# Patient Record
Sex: Female | Born: 1965 | Race: White | Hispanic: No | Marital: Married | Smoking: Never smoker
Health system: Southern US, Community
[De-identification: ages and names within clinical notes are randomized; demographics above are authoritative.]

## PROBLEM LIST (undated history)

## (undated) DIAGNOSIS — Z90721 Acquired absence of ovaries, unilateral: Secondary | ICD-10-CM

---

## 1999-08-10 ENCOUNTER — Other Ambulatory Visit: Admission: RE | Admit: 1999-08-10 | Discharge: 1999-08-10 | Payer: Self-pay | Admitting: Obstetrics and Gynecology

## 2000-02-09 ENCOUNTER — Inpatient Hospital Stay (HOSPITAL_COMMUNITY): Admission: AD | Admit: 2000-02-09 | Discharge: 2000-02-12 | Payer: Self-pay | Admitting: Obstetrics and Gynecology

## 2001-03-16 ENCOUNTER — Other Ambulatory Visit: Admission: RE | Admit: 2001-03-16 | Discharge: 2001-03-16 | Payer: Self-pay | Admitting: Obstetrics and Gynecology

## 2001-08-14 ENCOUNTER — Encounter: Admission: RE | Admit: 2001-08-14 | Discharge: 2001-08-14 | Payer: Self-pay | Admitting: Obstetrics and Gynecology

## 2001-08-14 ENCOUNTER — Encounter: Payer: Self-pay | Admitting: Obstetrics and Gynecology

## 2001-09-21 ENCOUNTER — Other Ambulatory Visit: Admission: RE | Admit: 2001-09-21 | Discharge: 2001-09-21 | Payer: Self-pay | Admitting: Obstetrics and Gynecology

## 2002-04-17 ENCOUNTER — Other Ambulatory Visit: Admission: RE | Admit: 2002-04-17 | Discharge: 2002-04-17 | Payer: Self-pay | Admitting: Obstetrics and Gynecology

## 2003-04-24 ENCOUNTER — Other Ambulatory Visit: Admission: RE | Admit: 2003-04-24 | Discharge: 2003-04-24 | Payer: Self-pay | Admitting: Obstetrics and Gynecology

## 2004-09-23 ENCOUNTER — Other Ambulatory Visit: Admission: RE | Admit: 2004-09-23 | Discharge: 2004-09-23 | Payer: Self-pay | Admitting: Obstetrics and Gynecology

## 2005-11-21 ENCOUNTER — Other Ambulatory Visit: Admission: RE | Admit: 2005-11-21 | Discharge: 2005-11-21 | Payer: Self-pay | Admitting: Obstetrics and Gynecology

## 2006-09-25 ENCOUNTER — Encounter: Admission: RE | Admit: 2006-09-25 | Discharge: 2006-09-25 | Payer: Self-pay | Admitting: Obstetrics and Gynecology

## 2007-10-09 ENCOUNTER — Encounter: Admission: RE | Admit: 2007-10-09 | Discharge: 2007-10-09 | Payer: Self-pay | Admitting: Obstetrics and Gynecology

## 2008-10-17 ENCOUNTER — Encounter: Admission: RE | Admit: 2008-10-17 | Discharge: 2008-10-17 | Payer: Self-pay | Admitting: Obstetrics and Gynecology

## 2009-10-19 ENCOUNTER — Encounter: Admission: RE | Admit: 2009-10-19 | Discharge: 2009-10-19 | Payer: Self-pay | Admitting: Obstetrics and Gynecology

## 2010-10-01 NOTE — H&P (Signed)
[facility] of [facility]  Patient:    Natasha Burke, Natasha Burke                           MRN: [id number] Adm. Date:  02/09/00 Attending:  Alvino Chapel, M.D. CC:         Preop holding for surgery on September 26, 7:30 a.m.   History and Physical  Patient scheduled for repeat cesarean section on Wednesday, September 26, at 7:30 a.m.  HISTORY OF PRESENT ILLNESS:   The patient is a 45 year old, G3, P1-0-1-1, who is admitted at [redacted] weeks gestation for scheduled repeat cesarean section.  The patients estimated due date is February 16, 2000, by last menstrual period consistent with a first trimester sonogram.  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  Otherwise, the patient had previous cesarean section and no other complications this pregnancy.  PRENATAL LABORATORY DATA:     O positive, antibody negative, RPR nonreactive, rubella immune, hepatitis B surface antigen negative, HIV declined, GC negative, chlamydia negative, group B strep negative.  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  The patient had a 9 pound 4 ounce infant by C section after a failed trial of labor.  PAST MEDICAL HISTORY:         None.  PAST SURGICAL HISTORY:        Cesarean section in 1999, D&E in 1997, ankle surgery, tonsillectomy.  PAST GYNECOLOGIC HISTORY:     None with no abnormal Pap smears.  MEDICATIONS:                  Prenatal vitamins only.  FAMILY HISTORY:               The patient is unaware of her family history given that she was adopted.  PHYSICAL EXAMINATION:  VITAL SIGNS:                  The patient is 200 pounds.  Blood pressure 105/70, fundal height 39, estimated fetal weight 8-1/2 pounds.  CARDIAC:                      Regular rate and  rhythm.  LUNGS:                        Clear to auscultation bilaterally.  ABDOMEN:                      Gravid and nontender.  PELVIC:                       Cervical exam is 40% effaced and 1+ cm dilated.  ASSESSMENT:                   After extensive counselling, the patient elected to proceed with a repeat cesarean section over trial of labor.  She was quoted the risks and benefits f vaginal birth after cesarean section versus a repeat cesarean section.  Specifically, risks of surgery were addressed including bleeding, infection, and possible damage to bowel and bladder.  With the patient aware of these risks, she agreed to proceed with cesarean section. The patient declined  tubal ligation at this time. DD:  02/08/00 TD:  02/08/00 Job: [id number]

## 2010-10-01 NOTE — Discharge Summary (Signed)
[facility] of [facility]  Patient:    Natasha Burke, Natasha Burke                         MRN: [id number] Adm. Date:  02/09/00 Disc. Date: 02/12/00 Attending:  Oliver Pila                           Discharge Summary  DISCHARGE DIAGNOSES:          1. Term pregnancy at 39 weeks, delivered.                               2. Previous cesarean section.                               3. Repeat low transverse cesarean section.  DISCHARGE MEDICATIONS:        1. Motrin 600 mg p.o. q.6h.                               2. Percocet one to two tablet p.o. q.4h. p.r.n.                               3. Prenatal vitamins one p.o. q.d.  FOLLOWUP:                     The patient is to follow up in approximately two weeks for an incision check, and in six weeks for her full postpartum examination.  HOSPITAL COURSE:              The patient is a 45 year old G 2, P 1, 0, 0, 1, who is admitted at 39 weeks after a repeat elective cesarean section.  The patient underwent a cesarean section without complications, delivered of a vigorous female infant.  Apgars were 9 and 9.  Weight was 8 pounds 5 ounces. The patient had normal ovaries and tubes and uterus noted at the time of the C-section, and was then admitted for routine postpartum care.  She did very well.  On postpartum day number one her hemoglobin was 11, from 13.3 preoperatively.  She continued to do well, and on postpartum day number three was discharged to home, tolerating a regular diet, passing flatus, voiding without difficulty, and with her pain well-controlled with p.o. medications.DD: 02/12/00 TD:  02/12/00 Job: [id number]

## 2010-10-01 NOTE — Op Note (Signed)
[facility] of [facility]  Patient:    Natasha Burke, Natasha Burke                         MRN: [id number] Proc. Date: 02/09/00 Attending:  Oliver Pila                           Operative Report  PREOPERATIVE DIAGNOSES:       1. Term pregnancy at 39 weeks.                               2. Previous cesarean section desiring                                  elective repeat.                               3. Previous macrosomia.  POSTOPERATIVE DIAGNOSES:      1. Term pregnancy at 39 weeks.                               2. Previous cesarean section desiring                                  elective repeat.                               3. Previous macrosomia.  OPERATION:                    Repeat low transverse cesarean section.  SURGEON:                      Alvino Chapel, M.D.  ASSISTANT:  ANESTHESIA:                   Spinal anesthesia.  ESTIMATED BLOOD LOSS:         850 cc.  URINE OUTPUT:                 150 cc of clear urine.  IV FLUIDS:                    2500 cc lactated Ringers.  FINDINGS:                     A vigorous female infant in the vertex presentation.  Apgars were 9 and 9.  Weight was 8 pounds 5 ounces.  The ovaries were normal bilaterally.  DESCRIPTION OF PROCEDURE:     The patient was taken to the operating room where spinal anesthesia was obtained without difficulty and found to be adequate by Allis clamp test.  She was then prepped and draped in the normal sterile fashion in the dorsal supine position with a leftward tilt.  A Pfannenstiel skin incision was then made through a preexisting scar and carried down to the underlying layer of fascia by sharp dissection and Bovie cautery.  Fascia was then nicked in the midline and the incision was extended laterally with Mayo scissors.  The inferior aspect  of the incision was grasped with Kocher clamps, elevated and dissected off the underlying rectus muscles. In a similar fashion, the  superior aspect of the incision was grasped with Kocher clamps, elevated and dissected off the underlying rectus muscles.  The rectus muscles were then separated in the midline and the peritoneum identified, grasped with hemostats x 2 and entered sharply. The peritoneal incision was then extended both superiorly and inferiorly with Metzenbaum scissors with careful attention to avoid both bowel and bladder and the bladder blade was then inserted and the vesicouterine peritoneum identified, grasped with hemostats and the bladder flap created with Metzenbaum scissors. The bladder blade was then reinserted and the lower uterine segment was incised in a transverse fashion.  This incision was extended with bandage scissors.  The infants head was then brought to the level of the incision. There was some difficulty in delivering the head with fundal pressure alone as the vertex was floating quite high. Therefore, the vacuum was applied to the fetal vertex and the infants head delivered atraumatically with vacuum assistance. The nose and mouth were then bulb suctioned without difficulty and the remainder of the infants body delivered with cord clamped and cut and the infant handed to pediatricians.  The uterus was then cleared of all clots and debris and the placenta delivered manually with a moist lap sponge utilized to wipe the uterus once again after delivery of placenta.  The uterine incision was then repaired with 0 chromic in running fashion.  One small area of bleeding was controlled with figure-of-eight sutures.  The incision appeared hemostatic. The tubes and ovaries were inspected and found to be within normal limits, therefore all instruments were removed from the abdomen and the fascia was closed with 0 Vicryl in a running fashion. The skin was closed with staples.  Sponge, lap and needle counts correct x 2 and the patient was taken to the recovery room in stable condition. DD:   02/09/00 TD:  02/09/00 Job: [id number]

## 2010-10-04 ENCOUNTER — Other Ambulatory Visit: Payer: Self-pay | Admitting: Obstetrics and Gynecology

## 2010-10-04 DIAGNOSIS — Z1231 Encounter for screening mammogram for malignant neoplasm of breast: Secondary | ICD-10-CM

## 2010-11-01 ENCOUNTER — Ambulatory Visit
Admission: RE | Admit: 2010-11-01 | Discharge: 2010-11-01 | Disposition: A | Discharge status: Home / Self Care | Payer: BC Managed Care – PPO | Source: Ambulatory Visit | Attending: Obstetrics and Gynecology | Admitting: Obstetrics and Gynecology

## 2010-11-01 DIAGNOSIS — Z1231 Encounter for screening mammogram for malignant neoplasm of breast: Secondary | ICD-10-CM

## 2011-09-27 ENCOUNTER — Other Ambulatory Visit: Payer: Self-pay | Admitting: Obstetrics and Gynecology

## 2011-09-27 DIAGNOSIS — Z1231 Encounter for screening mammogram for malignant neoplasm of breast: Secondary | ICD-10-CM

## 2011-11-02 ENCOUNTER — Ambulatory Visit
Admission: RE | Admit: 2011-11-02 | Discharge: 2011-11-02 | Disposition: A | Discharge status: Home / Self Care | Payer: BC Managed Care – PPO | Source: Ambulatory Visit | Attending: Obstetrics and Gynecology | Admitting: Obstetrics and Gynecology

## 2011-11-02 DIAGNOSIS — Z1231 Encounter for screening mammogram for malignant neoplasm of breast: Secondary | ICD-10-CM

## 2012-05-16 HISTORY — PX: SHOULDER ARTHROSCOPY: SHX128

## 2012-10-04 ENCOUNTER — Other Ambulatory Visit: Payer: Self-pay

## 2012-10-04 DIAGNOSIS — Z1231 Encounter for screening mammogram for malignant neoplasm of breast: Secondary | ICD-10-CM

## 2012-11-09 ENCOUNTER — Ambulatory Visit: Payer: BC Managed Care – PPO

## 2012-11-30 ENCOUNTER — Ambulatory Visit: Payer: BC Managed Care – PPO

## 2013-01-22 ENCOUNTER — Ambulatory Visit
Admission: RE | Admit: 2013-01-22 | Discharge: 2013-01-22 | Disposition: A | Discharge status: Home / Self Care | Payer: BC Managed Care – PPO | Source: Ambulatory Visit

## 2013-01-22 DIAGNOSIS — Z1231 Encounter for screening mammogram for malignant neoplasm of breast: Secondary | ICD-10-CM

## 2013-02-05 ENCOUNTER — Other Ambulatory Visit: Payer: Self-pay | Admitting: *Deleted

## 2013-12-18 ENCOUNTER — Other Ambulatory Visit: Payer: Self-pay

## 2013-12-18 DIAGNOSIS — Z1231 Encounter for screening mammogram for malignant neoplasm of breast: Secondary | ICD-10-CM

## 2014-01-24 ENCOUNTER — Encounter (INDEPENDENT_AMBULATORY_CARE_PROVIDER_SITE_OTHER): Payer: Self-pay

## 2014-01-24 ENCOUNTER — Ambulatory Visit
Admission: RE | Admit: 2014-01-24 | Discharge: 2014-01-24 | Disposition: A | Discharge status: Home / Self Care | Payer: BC Managed Care – PPO | Source: Ambulatory Visit

## 2014-01-24 DIAGNOSIS — Z1231 Encounter for screening mammogram for malignant neoplasm of breast: Secondary | ICD-10-CM

## 2014-12-31 ENCOUNTER — Other Ambulatory Visit: Payer: Self-pay

## 2014-12-31 DIAGNOSIS — Z1231 Encounter for screening mammogram for malignant neoplasm of breast: Secondary | ICD-10-CM

## 2015-01-26 ENCOUNTER — Ambulatory Visit: Payer: BC Managed Care – PPO

## 2015-02-02 ENCOUNTER — Ambulatory Visit: Payer: BC Managed Care – PPO

## 2015-03-25 ENCOUNTER — Ambulatory Visit
Admission: RE | Admit: 2015-03-25 | Discharge: 2015-03-25 | Disposition: A | Discharge status: Home / Self Care | Payer: BC Managed Care – PPO | Source: Ambulatory Visit

## 2015-03-25 DIAGNOSIS — Z1231 Encounter for screening mammogram for malignant neoplasm of breast: Secondary | ICD-10-CM

## 2016-03-07 ENCOUNTER — Other Ambulatory Visit: Payer: Self-pay | Admitting: Obstetrics and Gynecology

## 2016-03-07 DIAGNOSIS — Z1231 Encounter for screening mammogram for malignant neoplasm of breast: Secondary | ICD-10-CM

## 2016-03-29 ENCOUNTER — Ambulatory Visit
Admission: RE | Admit: 2016-03-29 | Discharge: 2016-03-29 | Disposition: A | Discharge status: Home / Self Care | Payer: BC Managed Care – PPO | Source: Ambulatory Visit | Attending: Obstetrics and Gynecology | Admitting: Obstetrics and Gynecology

## 2016-03-29 DIAGNOSIS — Z1231 Encounter for screening mammogram for malignant neoplasm of breast: Secondary | ICD-10-CM

## 2016-04-01 ENCOUNTER — Other Ambulatory Visit: Payer: Self-pay | Admitting: Obstetrics and Gynecology

## 2016-04-01 DIAGNOSIS — R928 Other abnormal and inconclusive findings on diagnostic imaging of breast: Secondary | ICD-10-CM

## 2016-04-12 ENCOUNTER — Ambulatory Visit
Admission: RE | Admit: 2016-04-12 | Discharge: 2016-04-12 | Disposition: A | Discharge status: Home / Self Care | Payer: BC Managed Care – PPO | Source: Ambulatory Visit | Attending: Obstetrics and Gynecology | Admitting: Obstetrics and Gynecology

## 2016-04-12 DIAGNOSIS — R928 Other abnormal and inconclusive findings on diagnostic imaging of breast: Secondary | ICD-10-CM

## 2017-03-06 ENCOUNTER — Other Ambulatory Visit: Payer: Self-pay | Admitting: Obstetrics and Gynecology

## 2017-03-06 DIAGNOSIS — Z1231 Encounter for screening mammogram for malignant neoplasm of breast: Secondary | ICD-10-CM

## 2017-03-30 ENCOUNTER — Ambulatory Visit
Admission: RE | Admit: 2017-03-30 | Discharge: 2017-03-30 | Disposition: A | Discharge status: Home / Self Care | Payer: BC Managed Care – PPO | Source: Ambulatory Visit | Attending: Obstetrics and Gynecology | Admitting: Obstetrics and Gynecology

## 2017-03-30 DIAGNOSIS — Z1231 Encounter for screening mammogram for malignant neoplasm of breast: Secondary | ICD-10-CM

## 2017-06-13 ENCOUNTER — Encounter (HOSPITAL_BASED_OUTPATIENT_CLINIC_OR_DEPARTMENT_OTHER): Payer: Self-pay

## 2017-06-13 ENCOUNTER — Other Ambulatory Visit: Payer: Self-pay

## 2017-06-13 NOTE — Progress Notes (Signed)
Spoke with:  Salomon Fick NPO:  After Midnight, no gum, candy, or mints   Arrival time:  6:00AM Labs:  Urine Preg DOS, CBC, CMP, T&S drawn 06/16/17 AM medications: None Pre op orders: Yes Ride home: Shanon Brow (husband) [phone]

## 2017-06-16 ENCOUNTER — Encounter (HOSPITAL_COMMUNITY)
Admission: RE | Admit: 2017-06-16 | Discharge: 2017-06-16 | Disposition: A | Discharge status: Home / Self Care | Payer: BC Managed Care – PPO | Source: Ambulatory Visit | Attending: Obstetrics and Gynecology | Admitting: Obstetrics and Gynecology

## 2017-06-16 DIAGNOSIS — R102 Pelvic and perineal pain: Secondary | ICD-10-CM | POA: Diagnosis not present

## 2017-06-16 DIAGNOSIS — R140 Abdominal distension (gaseous): Secondary | ICD-10-CM | POA: Insufficient documentation

## 2017-06-16 DIAGNOSIS — Z01812 Encounter for preprocedural laboratory examination: Secondary | ICD-10-CM | POA: Insufficient documentation

## 2017-06-16 DIAGNOSIS — N83202 Unspecified ovarian cyst, left side: Secondary | ICD-10-CM | POA: Insufficient documentation

## 2017-06-16 LAB — COMPREHENSIVE METABOLIC PANEL
ALBUMIN: 3.9 g/dL (ref 3.5–5.0)
ALT: 14 U/L (ref 14–54)
ANION GAP: 6 (ref 5–15)
AST: 17 U/L (ref 15–41)
Alkaline Phosphatase: 66 U/L (ref 38–126)
BUN: 14 mg/dL (ref 6–20)
CHLORIDE: 105 mmol/L (ref 101–111)
CO2: 27 mmol/L (ref 22–32)
Calcium: 9.1 mg/dL (ref 8.9–10.3)
Creatinine, Ser: 0.76 mg/dL (ref 0.44–1.00)
GFR calc Af Amer: >60 mL/min (ref >60)
GFR calc non Af Amer: >60 mL/min (ref >60)
GLUCOSE: 101 mg/dL — AB (ref 65–99)
POTASSIUM: 4.2 mmol/L (ref 3.5–5.1)
Sodium: 138 mmol/L (ref 135–145)
Total Bilirubin: 0.3 mg/dL (ref 0.3–1.2)
Total Protein: 7.3 g/dL (ref 6.5–8.1)

## 2017-06-16 LAB — CBC
HCT: 36 % (ref 36.0–46.0)
Hemoglobin: 12.4 g/dL (ref 12.0–15.0)
MCH: 30.6 pg (ref 26.0–34.0)
MCHC: 34.4 g/dL (ref 30.0–36.0)
MCV: 88.9 fL (ref 78.0–100.0)
Platelets: 249 10*3/uL (ref 150–400)
RBC: 4.05 MIL/uL (ref 3.87–5.11)
RDW: 12.3 % (ref 11.5–15.5)
WBC: 6.9 10*3/uL (ref 4.0–10.5)

## 2017-06-16 LAB — TYPE AND SCREEN
ABO/RH(D): O POS
Antibody Screen: NEGATIVE

## 2017-06-17 LAB — ABO/RH: ABO/RH(D): O POS

## 2017-06-19 ENCOUNTER — Other Ambulatory Visit (HOSPITAL_COMMUNITY): Payer: BC Managed Care – PPO

## 2017-06-26 DIAGNOSIS — N83202 Unspecified ovarian cyst, left side: Secondary | ICD-10-CM | POA: Diagnosis present

## 2017-06-26 NOTE — H&P (Signed)
Natasha Burke is an 52 y.o. female.[id number] with persistent L ovarian cyst.  Pt desires removal.  Cyst is simple.  Has been present since 10/18.  Also had cyst in endometrium, EMB benign.  Consisten with adenomyosis.    D/w pt r/b/a of l/S LSO, desires to proceed.  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    History reviewed. No pertinent past medical history.  Past Surgical History:  Procedure Laterality Date  . [place], 2001  . SHOULDER ARTHROSCOPY Right 2014  ankle surgery D&C  FH: unknown -pt adopted  Social History:  reports that  has never smoked. she has never used smokeless tobacco. She reports that she drinks alcohol. She reports that she does not use drugs. 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 Sulfa Antibiotics Rash        Review of Systems  Constitutional: Negative.   HENT: Negative.   Eyes: Negative.   Respiratory: Negative.   Cardiovascular: Negative.   Gastrointestinal:       Bloating  Genitourinary: Negative.   Musculoskeletal: Negative.   Skin: Negative.   Neurological: Negative.   Psychiatric/Behavioral: Negative.     Height 5\' 7"  (1.702 m), weight 94.3 kg (208 lb), last menstrual period 06/10/2017. Physical Exam  Constitutional: She is oriented to person, place, and time. She appears well-developed and well-nourished.  HENT:  Head: Normocephalic and atraumatic.  Cardiovascular: Normal rate and regular rhythm.  Respiratory: Effort normal and breath sounds normal. No respiratory distress. She has no wheezes.  GI: Soft. Bowel sounds are normal. She exhibits no distension. There is no tenderness.  Musculoskeletal: Normal range of motion.   Neurological: She is alert and oriented to person, place, and time.  Skin: Skin is warm and dry.  Psychiatric: She has a normal mood and affect. Her behavior is normal.    [place] persistent L ovarian cyst - 6cm.  Simple.  Assessment/Plan: [id number] with persistent ovarian cyst For L/S LSO - d/w pt r/b/a will proceed   Evart Mcdonnell Bovard-Stuckert 06/26/2017, 9:02 PM

## 2017-06-26 NOTE — Anesthesia Preprocedure Evaluation (Addendum)
Anesthesia Evaluation  Patient identified by MRN, date of birth, ID band Patient awake    Reviewed: Allergy & Precautions, NPO status , Patient's Chart, lab work & pertinent test results  History of Anesthesia Complications (+) Emergence Delirium  Airway Mallampati: I  TM Distance: >3 FB Neck ROM: Full    Dental no notable dental hx. (+) Teeth Intact, Dental Advisory Given   Pulmonary neg pulmonary ROS,    Pulmonary exam normal breath sounds clear to auscultation       Cardiovascular negative cardio ROS Normal cardiovascular exam Rhythm:Regular Rate:Normal     Neuro/Psych negative neurological ROS  negative psych ROS   GI/Hepatic negative GI ROS, Neg liver ROS,   Endo/Other  negative endocrine ROS  Renal/GU negative Renal ROS     Musculoskeletal negative musculoskeletal ROS (+)   Abdominal (+) + obese,   Peds  Hematology negative hematology ROS (+)   Anesthesia Other Findings left ovarian cyst  Reproductive/Obstetrics                           Anesthesia Physical Anesthesia Plan  ASA: II  Anesthesia Plan: General   Post-op Pain Management:    Induction: Intravenous  PONV Risk Score and Plan: 4 or greater and Scopolamine patch - Pre-op, Midazolam, Dexamethasone, Ondansetron and Treatment may vary due to age or medical condition  Airway Management Planned: Oral ETT  Additional Equipment:   Intra-op Plan:   Post-operative Plan: Extubation in OR  Informed Consent: I have reviewed the patients History and Physical, chart, labs and discussed the procedure including the risks, benefits and alternatives for the proposed anesthesia with the patient or authorized representative who has indicated his/her understanding and acceptance.   Dental advisory given  Plan Discussed with: CRNA  Anesthesia Plan Comments:         Anesthesia Quick Evaluation

## 2017-06-27 ENCOUNTER — Other Ambulatory Visit: Payer: Self-pay

## 2017-06-27 ENCOUNTER — Ambulatory Visit (HOSPITAL_BASED_OUTPATIENT_CLINIC_OR_DEPARTMENT_OTHER): Payer: BC Managed Care – PPO | Admitting: Anesthesiology

## 2017-06-27 ENCOUNTER — Ambulatory Visit (HOSPITAL_BASED_OUTPATIENT_CLINIC_OR_DEPARTMENT_OTHER)
Admission: RE | Admit: 2017-06-27 | Discharge: 2017-06-27 | Disposition: A | Discharge status: Home / Self Care | Payer: BC Managed Care – PPO | Source: Ambulatory Visit | Attending: Obstetrics and Gynecology | Admitting: Obstetrics and Gynecology

## 2017-06-27 ENCOUNTER — Encounter (HOSPITAL_BASED_OUTPATIENT_CLINIC_OR_DEPARTMENT_OTHER): Payer: Self-pay | Admitting: *Deleted

## 2017-06-27 ENCOUNTER — Encounter (HOSPITAL_BASED_OUTPATIENT_CLINIC_OR_DEPARTMENT_OTHER)
Admission: RE | Disposition: A | Discharge status: Home / Self Care | Payer: Self-pay | Source: Ambulatory Visit | Attending: Obstetrics and Gynecology

## 2017-06-27 DIAGNOSIS — N83202 Unspecified ovarian cyst, left side: Secondary | ICD-10-CM | POA: Diagnosis present

## 2017-06-27 DIAGNOSIS — D271 Benign neoplasm of left ovary: Secondary | ICD-10-CM | POA: Diagnosis not present

## 2017-06-27 DIAGNOSIS — Z90721 Acquired absence of ovaries, unilateral: Secondary | ICD-10-CM

## 2017-06-27 DIAGNOSIS — N8302 Follicular cyst of left ovary: Secondary | ICD-10-CM | POA: Diagnosis not present

## 2017-06-27 HISTORY — DX: Acquired absence of ovaries, unilateral: Z90.721

## 2017-06-27 LAB — POCT PREGNANCY, URINE: PREG TEST UR: NEGATIVE

## 2017-06-27 SURGERY — OOPHORECTOMY, LAPAROSCOPIC
Anesthesia: General | Laterality: Left

## 2017-06-27 MED ORDER — FENTANYL CITRATE (PF) 100 MCG/2ML IJ SOLN
INTRAMUSCULAR | Status: AC
  Filled 2017-06-27: qty 2

## 2017-06-27 MED ORDER — SCOPOLAMINE 1 MG/3DAYS TD PT72
1.0000 | MEDICATED_PATCH | TRANSDERMAL | Status: DC
  Administered 2017-06-27: 1.5 mg via TRANSDERMAL
  Filled 2017-06-27: qty 1

## 2017-06-27 MED ORDER — ROCURONIUM BROMIDE 10 MG/ML (PF) SYRINGE
PREFILLED_SYRINGE | INTRAVENOUS | Status: DC | PRN
  Administered 2017-06-27: 10 mg via INTRAVENOUS
  Administered 2017-06-27: 35 mg via INTRAVENOUS
  Administered 2017-06-27: 10 mg via INTRAVENOUS

## 2017-06-27 MED ORDER — ROCURONIUM BROMIDE 10 MG/ML (PF) SYRINGE
PREFILLED_SYRINGE | INTRAVENOUS | Status: AC
  Filled 2017-06-27: qty 5

## 2017-06-27 MED ORDER — ACETAMINOPHEN 500 MG PO TABS
ORAL_TABLET | ORAL | Status: AC
  Filled 2017-06-27: qty 2

## 2017-06-27 MED ORDER — SUCCINYLCHOLINE CHLORIDE 200 MG/10ML IV SOSY
PREFILLED_SYRINGE | INTRAVENOUS | Status: AC
  Filled 2017-06-27: qty 10

## 2017-06-27 MED ORDER — ACETAMINOPHEN 500 MG PO TABS
1000.0000 mg | ORAL_TABLET | Freq: Once | ORAL | Status: AC
  Administered 2017-06-27: 1000 mg via ORAL
  Filled 2017-06-27: qty 2

## 2017-06-27 MED ORDER — FAMOTIDINE 20 MG PO TABS
20.0000 mg | ORAL_TABLET | Freq: Once | ORAL | Status: AC
Start: 2017-06-27 — End: 2017-06-27
  Administered 2017-06-27: 20 mg via ORAL
  Filled 2017-06-27: qty 1

## 2017-06-27 MED ORDER — ONDANSETRON HCL 4 MG/2ML IJ SOLN
INTRAMUSCULAR | Status: DC | PRN
  Administered 2017-06-27: 4 mg via INTRAVENOUS

## 2017-06-27 MED ORDER — SCOPOLAMINE 1 MG/3DAYS TD PT72
MEDICATED_PATCH | TRANSDERMAL | Status: AC
  Filled 2017-06-27: qty 1

## 2017-06-27 MED ORDER — FENTANYL CITRATE (PF) 100 MCG/2ML IJ SOLN
INTRAMUSCULAR | Status: DC | PRN
  Administered 2017-06-27 (×2): 50 ug via INTRAVENOUS
  Administered 2017-06-27: 100 ug via INTRAVENOUS

## 2017-06-27 MED ORDER — OXYCODONE HCL 5 MG/5ML PO SOLN
5.0000 mg | Freq: Once | ORAL | Status: AC | PRN
  Filled 2017-06-27: qty 5

## 2017-06-27 MED ORDER — MIDAZOLAM HCL 2 MG/2ML IJ SOLN
INTRAMUSCULAR | Status: AC
  Filled 2017-06-27: qty 2

## 2017-06-27 MED ORDER — ONDANSETRON HCL 4 MG/2ML IJ SOLN
INTRAMUSCULAR | Status: AC
  Filled 2017-06-27: qty 2

## 2017-06-27 MED ORDER — MIDAZOLAM HCL 2 MG/2ML IJ SOLN
INTRAMUSCULAR | Status: DC | PRN
  Administered 2017-06-27: 2 mg via INTRAVENOUS

## 2017-06-27 MED ORDER — PROPOFOL 500 MG/50ML IV EMUL
INTRAVENOUS | Status: AC
  Filled 2017-06-27: qty 50

## 2017-06-27 MED ORDER — OXYCODONE HCL 5 MG PO TABS
ORAL_TABLET | ORAL | Status: AC
  Filled 2017-06-27: qty 1

## 2017-06-27 MED ORDER — FAMOTIDINE 20 MG PO TABS
ORAL_TABLET | ORAL | Status: AC
  Filled 2017-06-27: qty 1

## 2017-06-27 MED ORDER — KETOROLAC TROMETHAMINE 30 MG/ML IJ SOLN
30.0000 mg | Freq: Once | INTRAMUSCULAR | Status: DC | PRN
  Filled 2017-06-27: qty 1

## 2017-06-27 MED ORDER — DEXAMETHASONE SODIUM PHOSPHATE 10 MG/ML IJ SOLN
INTRAMUSCULAR | Status: DC | PRN
  Administered 2017-06-27: 10 mg via INTRAVENOUS

## 2017-06-27 MED ORDER — SUGAMMADEX SODIUM 200 MG/2ML IV SOLN
INTRAVENOUS | Status: AC
  Filled 2017-06-27: qty 2

## 2017-06-27 MED ORDER — PROPOFOL 10 MG/ML IV BOLUS
INTRAVENOUS | Status: DC | PRN
  Administered 2017-06-27: 50 mg via INTRAVENOUS
  Administered 2017-06-27: 200 mg via INTRAVENOUS

## 2017-06-27 MED ORDER — LACTATED RINGERS IV SOLN
INTRAVENOUS | Status: DC
  Administered 2017-06-27 (×2): via INTRAVENOUS
  Filled 2017-06-27: qty 1000

## 2017-06-27 MED ORDER — BUPIVACAINE HCL (PF) 0.25 % IJ SOLN
INTRAMUSCULAR | Status: DC | PRN
  Administered 2017-06-27: 16 mL

## 2017-06-27 MED ORDER — ARTIFICIAL TEARS OPHTHALMIC OINT
TOPICAL_OINTMENT | OPHTHALMIC | Status: AC
  Filled 2017-06-27: qty 3.5

## 2017-06-27 MED ORDER — KETOROLAC TROMETHAMINE 30 MG/ML IJ SOLN
INTRAMUSCULAR | Status: DC | PRN
  Administered 2017-06-27: 30 mg via INTRAVENOUS

## 2017-06-27 MED ORDER — PROPOFOL 10 MG/ML IV BOLUS
INTRAVENOUS | Status: AC
  Filled 2017-06-27: qty 40

## 2017-06-27 MED ORDER — DEXAMETHASONE SODIUM PHOSPHATE 10 MG/ML IJ SOLN
INTRAMUSCULAR | Status: AC
Start: 2017-06-27 — End: ?
  Filled 2017-06-27: qty 1

## 2017-06-27 MED ORDER — DEXAMETHASONE SODIUM PHOSPHATE 10 MG/ML IJ SOLN
INTRAMUSCULAR | Status: AC
  Filled 2017-06-27: qty 1

## 2017-06-27 MED ORDER — OXYCODONE HCL 5 MG PO TABS: ORAL_TABLET | ORAL | 0 refills | Status: DC

## 2017-06-27 MED ORDER — PROMETHAZINE HCL 25 MG/ML IJ SOLN
6.2500 mg | INTRAMUSCULAR | Status: DC | PRN
  Filled 2017-06-27: qty 1

## 2017-06-27 MED ORDER — LIDOCAINE 2% (20 MG/ML) 5 ML SYRINGE
INTRAMUSCULAR | Status: DC | PRN
  Administered 2017-06-27: 60 mg via INTRAVENOUS

## 2017-06-27 MED ORDER — LIDOCAINE 2% (20 MG/ML) 5 ML SYRINGE
INTRAMUSCULAR | Status: AC
Start: 2017-06-27 — End: ?
  Filled 2017-06-27: qty 5

## 2017-06-27 MED ORDER — IBUPROFEN 600 MG PO TABS: 600.0000 mg | ORAL_TABLET | Freq: Four times a day (QID) | ORAL | 1 refills | Status: DC | PRN

## 2017-06-27 MED ORDER — SUGAMMADEX SODIUM 200 MG/2ML IV SOLN
INTRAVENOUS | Status: DC | PRN
  Administered 2017-06-27: 200 mg via INTRAVENOUS

## 2017-06-27 MED ORDER — OXYCODONE HCL 5 MG PO TABS
5.0000 mg | ORAL_TABLET | Freq: Once | ORAL | Status: AC | PRN
  Administered 2017-06-27: 5 mg via ORAL
  Filled 2017-06-27: qty 1

## 2017-06-27 MED ORDER — HYDROMORPHONE HCL 1 MG/ML IJ SOLN
0.2500 mg | INTRAMUSCULAR | Status: DC | PRN
  Filled 2017-06-27: qty 0.5

## 2017-06-27 MED ORDER — KETOROLAC TROMETHAMINE 30 MG/ML IJ SOLN
INTRAMUSCULAR | Status: AC
  Filled 2017-06-27: qty 1

## 2017-06-27 SURGICAL SUPPLY — 36 items
ADH SKN CLS APL DERMABOND .7 (GAUZE/BANDAGES/DRESSINGS) ×3
BAG RETRIEVAL 10MM (BASKET) ×1
CABLE HIGH FREQUENCY MONO STRZ (ELECTRODE) IMPLANT
COVER MAYO STAND STRL (DRAPES) IMPLANT
DERMABOND ADVANCED (GAUZE/BANDAGES/DRESSINGS) ×6
DERMABOND ADVANCED .7 DNX12 (GAUZE/BANDAGES/DRESSINGS) ×1 IMPLANT
DRSG COVADERM PLUS 2X2 (GAUZE/BANDAGES/DRESSINGS) ×4 IMPLANT
DRSG OPSITE POSTOP 3X4 (GAUZE/BANDAGES/DRESSINGS) ×2 IMPLANT
DURAPREP 26ML APPLICATOR (WOUND CARE) ×3 IMPLANT
GAUZE VASELINE 3X9 (GAUZE/BANDAGES/DRESSINGS) ×2 IMPLANT
GLOVE BIO SURGEON STRL SZ 6.5 (GLOVE) ×2 IMPLANT
GLOVE BIO SURGEONS STRL SZ 6.5 (GLOVE) ×1
GLOVE BIOGEL PI IND STRL 7.0 (GLOVE) ×2 IMPLANT
GLOVE BIOGEL PI INDICATOR 7.0 (GLOVE) ×4
GOWN STRL REUS W/TWL LRG LVL3 (GOWN DISPOSABLE) ×6 IMPLANT
NEEDLE INSUFFLATION 120MM (ENDOMECHANICALS) ×3 IMPLANT
NS IRRIG 500ML POUR BTL (IV SOLUTION) ×3 IMPLANT
PACK LAPAROSCOPY BASIN (CUSTOM PROCEDURE TRAY) ×3 IMPLANT
PACK TRENDGUARD 450 HYBRID PRO (MISCELLANEOUS) IMPLANT
PAD ION LEFT ARM DISP (MISCELLANEOUS) IMPLANT
PAD POSITIONING PINK XL (MISCELLANEOUS) IMPLANT
SET IRRIG TUBING LAPAROSCOPIC (IRRIGATION / IRRIGATOR) IMPLANT
SHEARS HARMONIC ACE PLUS 36CM (ENDOMECHANICALS) ×2 IMPLANT
SLEEVE XCEL OPT CAN 5 100 (ENDOMECHANICALS) ×3 IMPLANT
SUT VICRYL 0 UR6 27IN ABS (SUTURE) ×4 IMPLANT
SUT VICRYL 4-0 PS2 18IN ABS (SUTURE) ×3 IMPLANT
SYS BAG RETRIEVAL 10MM (BASKET) ×2
SYSTEM BAG RETRIEVAL 10MM (BASKET) IMPLANT
TOWEL OR 17X24 6PK STRL BLUE (TOWEL DISPOSABLE) ×6 IMPLANT
TRAY FOLEY BAG SILVER LF 14FR (CATHETERS) ×3 IMPLANT
TRENDGUARD 450 HYBRID PRO PACK (MISCELLANEOUS) ×3
TROCAR BALLN 12MMX100 BLUNT (TROCAR) ×2 IMPLANT
TROCAR XCEL NON-BLD 11X100MML (ENDOMECHANICALS) IMPLANT
TROCAR XCEL NON-BLD 5MMX100MML (ENDOMECHANICALS) ×3 IMPLANT
TUBING INSUF HEATED (TUBING) ×3 IMPLANT
WARMER LAPAROSCOPE (MISCELLANEOUS) ×3 IMPLANT

## 2017-06-27 NOTE — Anesthesia Procedure Notes (Signed)
Procedure Name: Intubation Date/Time: 06/27/2017 7:55 AM Performed by: Wanita Chamberlain, CRNA Pre-anesthesia Checklist: Patient identified, Emergency Drugs available, Suction available, Patient being monitored and Timeout performed Patient Re-evaluated:Patient Re-evaluated prior to induction Oxygen Delivery Method: Circle system utilized Preoxygenation: Pre-oxygenation with 100% oxygen Induction Type: IV induction Ventilation: Mask ventilation without difficulty Laryngoscope Size: Mac and 3 Grade View: Grade I Tube size: 7.0 mm Number of attempts: 1 Placement Confirmation: ETT inserted through vocal cords under direct vision,  positive ETCO2 and CO2 detector Secured at: 22 cm Tube secured with: Tape Dental Injury: Teeth and Oropharynx as per pre-operative assessment

## 2017-06-27 NOTE — Anesthesia Postprocedure Evaluation (Signed)
Anesthesia Post Note  Patient: Natasha Burke  Procedure(s) Performed: LAPAROSCOPIC LEFT SALPINGOOPHORECTOMY (Left )     Patient location during evaluation: PACU Anesthesia Type: General Level of consciousness: awake and alert Pain management: pain level controlled Vital Signs Assessment: post-procedure vital signs reviewed and stable Respiratory status: spontaneous breathing, nonlabored ventilation, respiratory function stable and patient connected to nasal cannula oxygen Cardiovascular status: blood pressure returned to baseline and stable Postop Assessment: no apparent nausea or vomiting Anesthetic complications: no    Last Vitals:  Vitals:   06/27/17 1015 06/27/17 1100  BP: 114/70 124/66  Pulse: 64 68  Resp: 10 16  Temp:  36.5 C  SpO2: 98% 100%    Last Pain:  Vitals:   06/27/17 1122  TempSrc:   PainSc: 2                  Adama Ivins P Marcella Charlson

## 2017-06-27 NOTE — Transfer of Care (Signed)
Immediate Anesthesia Transfer of Care Note  Patient: Natasha Burke  Procedure(s) Performed: LAPAROSCOPIC LEFT SALPINGOOPHORECTOMY (Left )  Patient Location: PACU  Anesthesia Type:General  Level of Consciousness: awake, alert , oriented and patient cooperative  Airway & Oxygen Therapy: Patient Spontanous Breathing and Patient connected to nasal cannula oxygen  Post-op Assessment: Report given to RN and Post -op Vital signs reviewed and stable  Post vital signs: Reviewed and stable  Last Vitals:  Vitals:   06/27/17 0607  BP: 117/67  Pulse: 68  Resp: 16  Temp: 36.5 C  SpO2: 97%    Last Pain:  Vitals:   06/27/17 0607  TempSrc: Oral      Patients Stated Pain Goal: 7 (47/07/61 5183)  Complications: No apparent anesthesia complications

## 2017-06-27 NOTE — Op Note (Signed)
NAMENORRIS, BODLEY                  ACCOUNT NO.:  [id number]  MEDICAL RECORD NO.:  [id number]  LOCATION:                                 FACILITY:  PHYSICIAN:  Thornell Sartorius, MD        DATE OF BIRTH:  [birth year]  DATE OF PROCEDURE:  06/27/2017 DATE OF DISCHARGE:  06/27/2017                              OPERATIVE REPORT   PREOPERATIVE DIAGNOSIS:  Persistent left ovarian cyst.  POSTOPERATIVE DIAGNOSIS:  Persistent left ovarian cyst.  PROCEDURE:  Laparoscopic left salpingo-oophorectomy.  SURGEON:  Thornell Sartorius, MD.  ASSISTANT:  Rhodia Albright, RNFA.  ANESTHESIA:  Local and general.  ESTIMATED BLOOD LOSS:  Approximately 10 mL.  IV FLUID:  Per anesthesia records.  URINE OUTPUT:  Per anesthesia records.  Urine was inspected and found to be clear at the end of the procedure.  COMPLICATIONS:  Likely uterine perforation with the Hulka tenaculum.  PATHOLOGY:  Left fallopian tube and ovary.  DESCRIPTION OF PROCEDURE:  After informed consent was reviewed with the patient including risks, benefits, and alternatives of this surgical procedure, she was transported to the operating room, placed on the table in supine position.  General anesthesia was induced and found to be adequate.  She was then placed in the [place] and her arms were bilaterally tied.  Prior to the procedure, a time-out was performed as well as she was prepped in the appropriate manner and a Foley catheter was placed.  A single-tooth tenaculum was used to attempt to place a Sales promotion account executive.  Gloves and gown were changed.  Attention was turned to the abdominal portion of the case and approximately 50 mm horizontal infraumbilical incision was made.  The fascia was cleared off with a hemostat and elevated with Kocher clamps and incised.  The anterior sheath of the fascia had gotten on the posterior sheath.  The balloon trocar was placed.  Balloon was placed to anchor the trocar. The accessory ports were placed  in the right and left under direct visualization after identification of the vessel.  A pelvic survey was performed.  The Hulka manipulator was noted to be anterior to the cervix where the tip of it was seen.  It was removed and a sponge stick was placed in the vagina.  The gloves and gown were changed.  Attention was turned back to the laparoscopic portion of the case.  Throughout the case, this area was monitored and felt to be stable.  The right tube and ovary were noted to be normal.  The uterus was noted to be somewhat bulky appearing.  The left tube and ovary were noted.  The left ovary was approximately 6-7 cm in size.  The tube was grasped and the ovary and tube were removed using the Harmonic Scalpel.  The uterine ligaments were lysed as well.  The tube and ovary were placed in an EndoCatch bag. This was brought to the surface at her umbilical incision.  The bag was opened and the cyst was drained.  Clear fluid was noted on drainage. This was drained enough to remove it from this port.  The balloon trocar was replaced.  A brief pelvic  survey performed revealed a stable size area of bleeding approximately 1 cm from the perforation.  The uterine was inspected and found to be clear in the bag.  The trocars were removed under direct visualization.  The gas was removed from the abdomen.  The trocar sites were closed with 4-0 subcuticular.  The stitches were then placed in the fascia to hold that was close.  A deep suture of 2-0 Vicryl was also placed and the 4-0 Vicryl in subcuticular fashion at the skin.  The patient tolerated the procedure well.  Sponge, lap, and needle count was correct x2.     Thornell Sartorius, MD     JB/MEDQ  D:  06/27/2017  T:  06/27/2017  Job:  [id number]

## 2017-06-27 NOTE — Op Note (Deleted)
  The note originally documented on this encounter has been moved the the encounter in which it belongs.  

## 2017-06-27 NOTE — Brief Op Note (Signed)
06/27/2017  9:09 AM  PATIENT:  Natasha Burke  52 y.o. female  PRE-OPERATIVE DIAGNOSIS:  left ovarian cyst  POST-OPERATIVE DIAGNOSIS:  left ovarian cyst  PROCEDURE:  Procedure(s) with comments: LAPAROSCOPIC LEFT SALPINGOOPHORECTOMY (Left) - MD Request RNFA  FINDINGS: large smooth cyst left ovary, nl rt tube and ovary.  Somewhat bulbous, boggy appearing uterus.    SURGEON:  Surgeon(s) and Role:    * Bovard-Stuckert, Starleen Trussell, MD - Primary  ASSISTANTS: Rhodia Albright RNFA   ANESTHESIA:   local and general  EBL:  10 mL   BLOOD ADMINISTERED:none  DRAINS: Urinary Catheter (Foley) during case, removed before PACU   LOCAL MEDICATIONS USED:  MARCAINE     SPECIMEN:  Source of Specimen:  Left fallopian tube and ovary  DISPOSITION OF SPECIMEN:  PATHOLOGY  COUNTS:  YES  TOURNIQUET:  * No tourniquets in log *  DICTATION: .Other Dictation: Dictation Number [id number]  PLAN OF CARE: Discharge to home after PACU  PATIENT DISPOSITION:  PACU - hemodynamically stable.   Delay start of Pharmacological VTE agent (>24hrs) due to surgical blood loss or risk of bleeding: not applicable

## 2017-06-27 NOTE — Interval H&P Note (Signed)
History and Physical Interval Note:  06/27/2017 7:31 AM  Natasha Burke  has presented today for surgery, with the diagnosis of left ovarian cyst  The various methods of treatment have been discussed with the patient and family. After consideration of risks, benefits and other options for treatment, the patient has consented to  Procedure(s) with comments: LAPAROSCOPIC OOPHORECTOMY (Left) - MD Request RNFA as a surgical intervention .  The patient's history has been reviewed, patient examined, no change in status, stable for surgery.  I have reviewed the patient's chart and labs.  Questions were answered to the patient's satisfaction.     Dasan Hardman Bovard-Stuckert

## 2017-06-27 NOTE — Discharge Instructions (Signed)

## 2018-03-12 ENCOUNTER — Other Ambulatory Visit: Payer: Self-pay | Admitting: Family Medicine

## 2018-03-12 DIAGNOSIS — Z1231 Encounter for screening mammogram for malignant neoplasm of breast: Secondary | ICD-10-CM

## 2018-04-24 ENCOUNTER — Ambulatory Visit
Admission: RE | Admit: 2018-04-24 | Discharge: 2018-04-24 | Disposition: A | Discharge status: Home / Self Care | Payer: BC Managed Care – PPO | Source: Ambulatory Visit | Attending: Family Medicine | Admitting: Family Medicine

## 2018-04-24 DIAGNOSIS — Z1231 Encounter for screening mammogram for malignant neoplasm of breast: Secondary | ICD-10-CM

## 2018-04-25 ENCOUNTER — Other Ambulatory Visit: Payer: Self-pay | Admitting: Family Medicine

## 2018-04-25 DIAGNOSIS — R928 Other abnormal and inconclusive findings on diagnostic imaging of breast: Secondary | ICD-10-CM

## 2018-04-27 ENCOUNTER — Ambulatory Visit: Payer: BC Managed Care – PPO

## 2018-04-27 ENCOUNTER — Ambulatory Visit
Admission: RE | Admit: 2018-04-27 | Discharge: 2018-04-27 | Disposition: A | Discharge status: Home / Self Care | Payer: BC Managed Care – PPO | Source: Ambulatory Visit | Attending: Family Medicine | Admitting: Family Medicine

## 2018-04-27 DIAGNOSIS — R928 Other abnormal and inconclusive findings on diagnostic imaging of breast: Secondary | ICD-10-CM

## 2019-03-29 ENCOUNTER — Other Ambulatory Visit: Payer: Self-pay | Admitting: Family Medicine

## 2019-03-29 DIAGNOSIS — Z1231 Encounter for screening mammogram for malignant neoplasm of breast: Secondary | ICD-10-CM

## 2019-05-27 ENCOUNTER — Ambulatory Visit: Payer: BC Managed Care – PPO

## 2019-07-11 ENCOUNTER — Ambulatory Visit
Admission: RE | Admit: 2019-07-11 | Discharge: 2019-07-11 | Disposition: A | Discharge status: Home / Self Care | Payer: BC Managed Care – PPO | Source: Ambulatory Visit | Attending: Family Medicine | Admitting: Family Medicine

## 2019-07-11 ENCOUNTER — Other Ambulatory Visit: Payer: Self-pay

## 2019-07-11 DIAGNOSIS — Z1231 Encounter for screening mammogram for malignant neoplasm of breast: Secondary | ICD-10-CM

## 2020-06-03 ENCOUNTER — Other Ambulatory Visit: Payer: Self-pay | Admitting: Family Medicine

## 2020-06-03 DIAGNOSIS — Z1231 Encounter for screening mammogram for malignant neoplasm of breast: Secondary | ICD-10-CM

## 2020-07-17 ENCOUNTER — Ambulatory Visit: Payer: BC Managed Care – PPO

## 2020-08-06 IMAGING — MG DIGITAL SCREENING BILAT W/ TOMO W/ CAD
8 series · 8 of 24 positions shown · non-contrast
Comparison: Previous exam(s).

CLINICAL DATA: Screening.

EXAM:
DIGITAL SCREENING BILATERAL MAMMOGRAM WITH TOMO AND CAD

[R CC synth-2D]
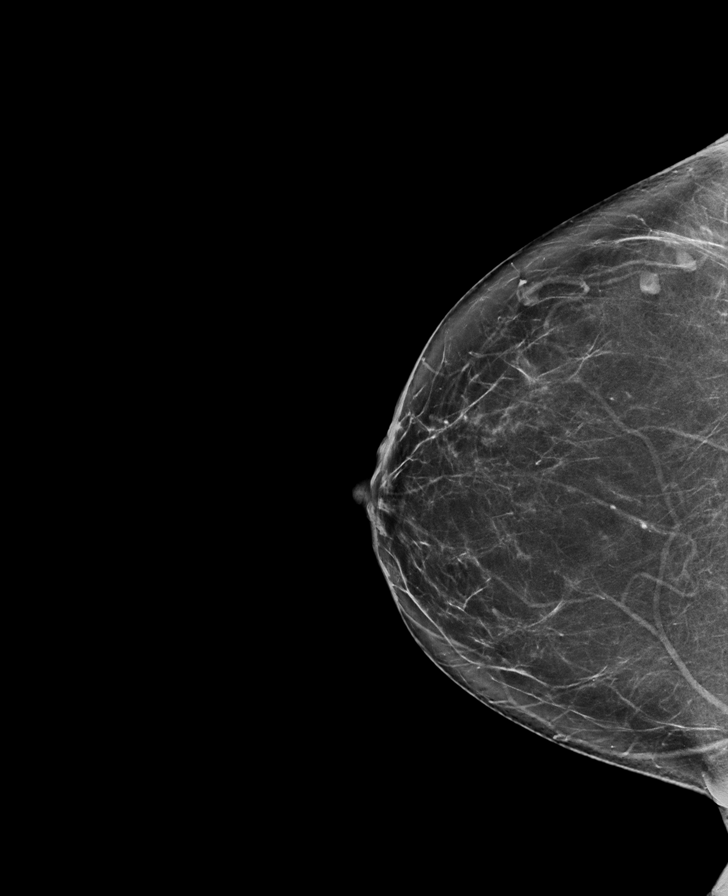

[L MLO synth-2D]
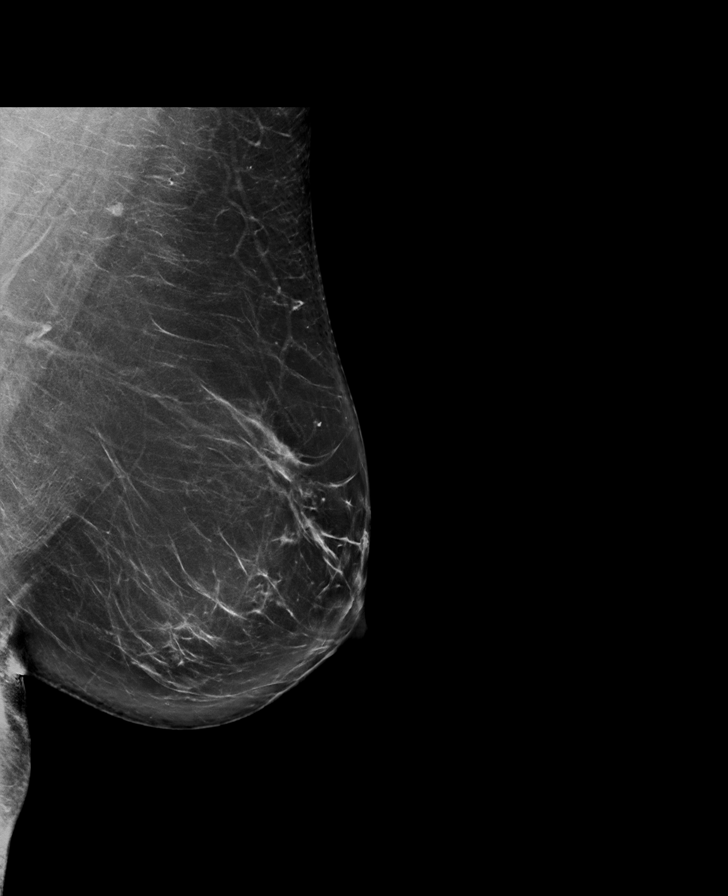

[R MLO synth-2D]
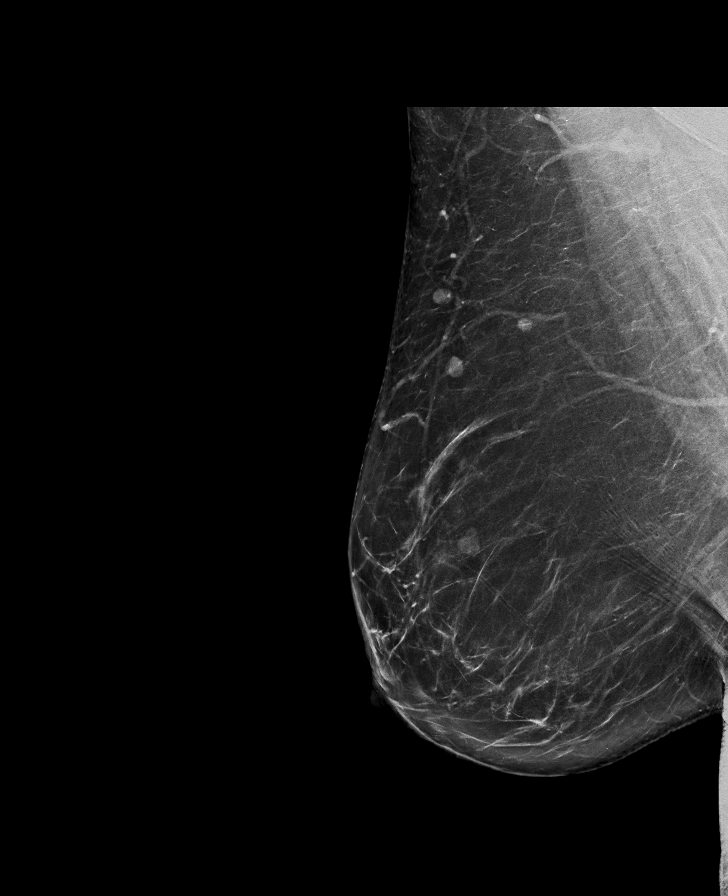

[L CC synth-2D]
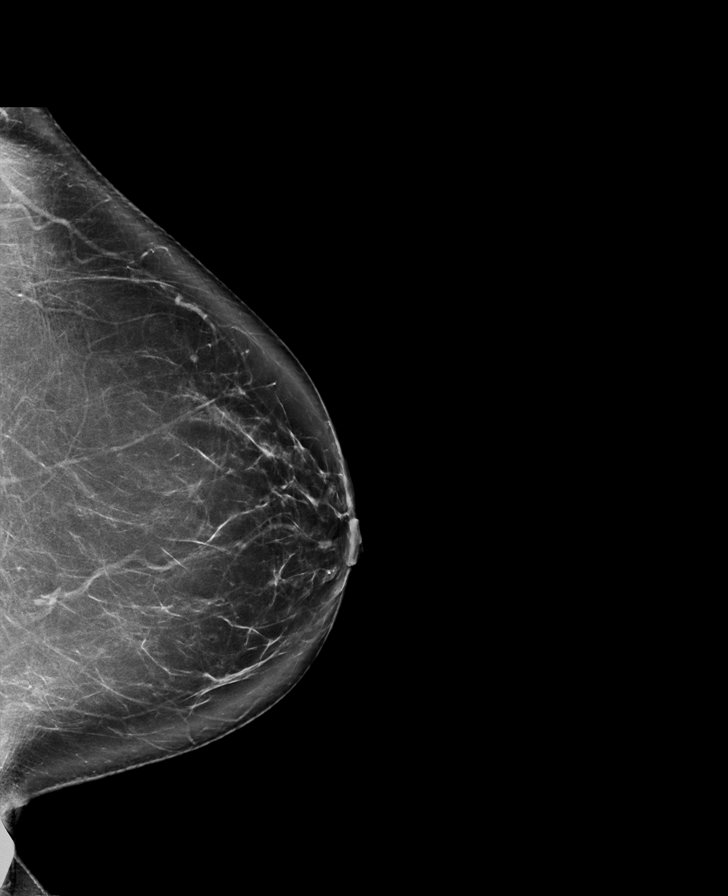

[L MLO tomo · tomo slice 47/92.0]
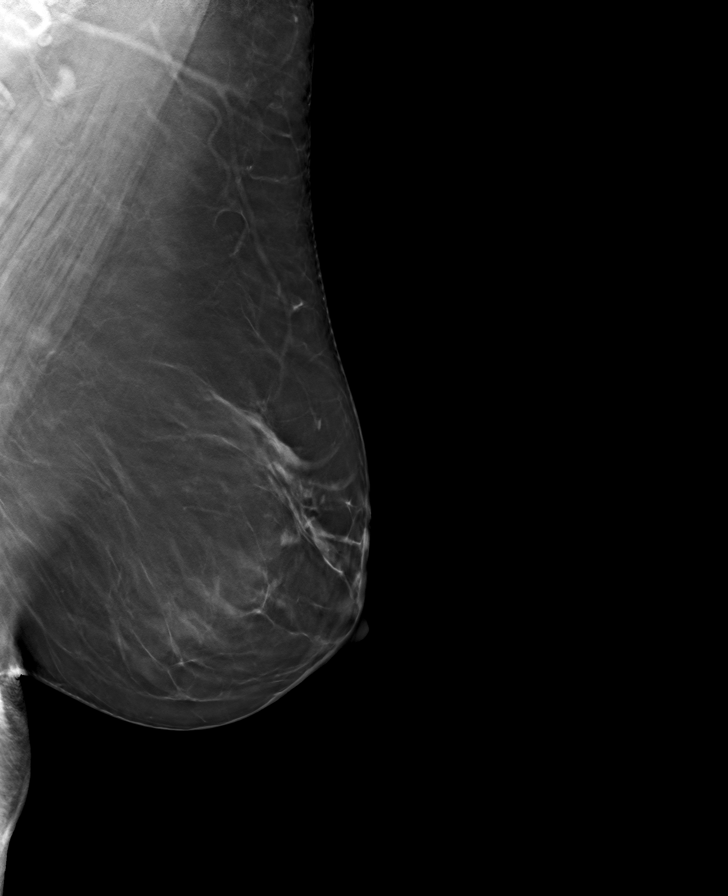

[R CC tomo · tomo slice 38/75.0]
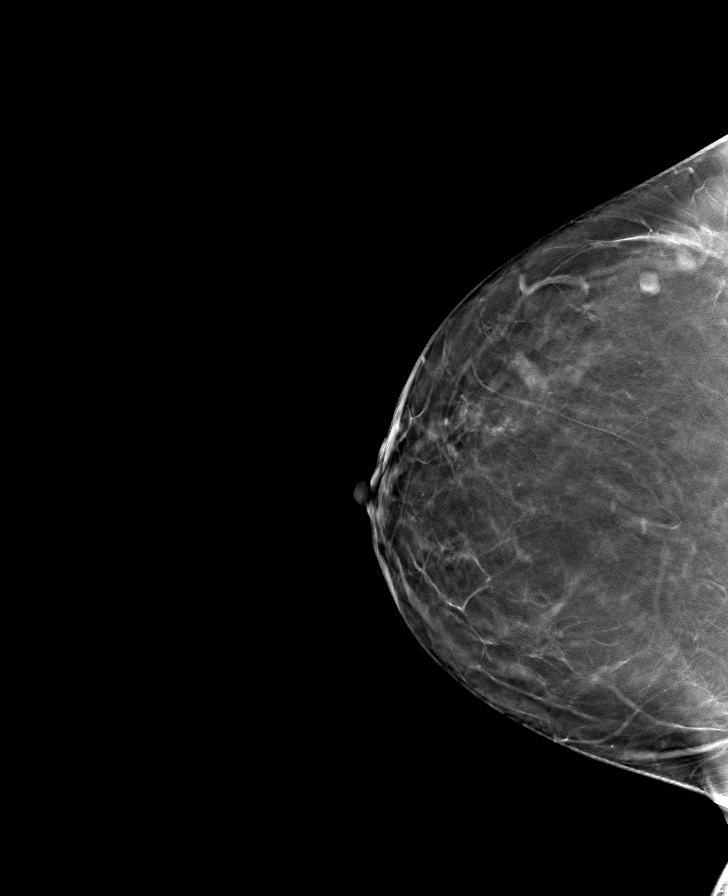

[R MLO tomo · tomo slice 45/90.0]
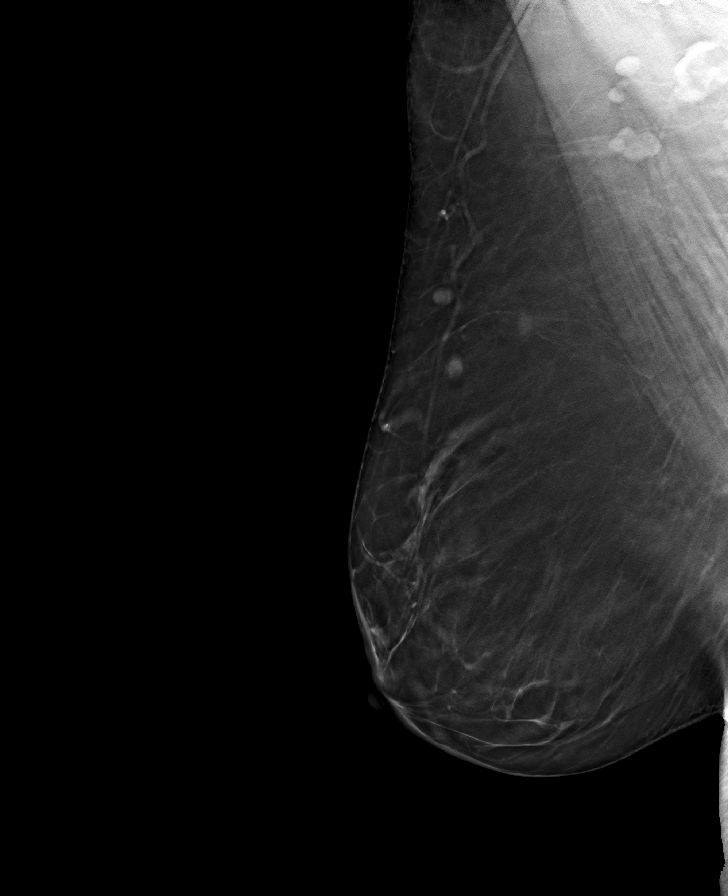

[L CC tomo · tomo slice 43/85.0]
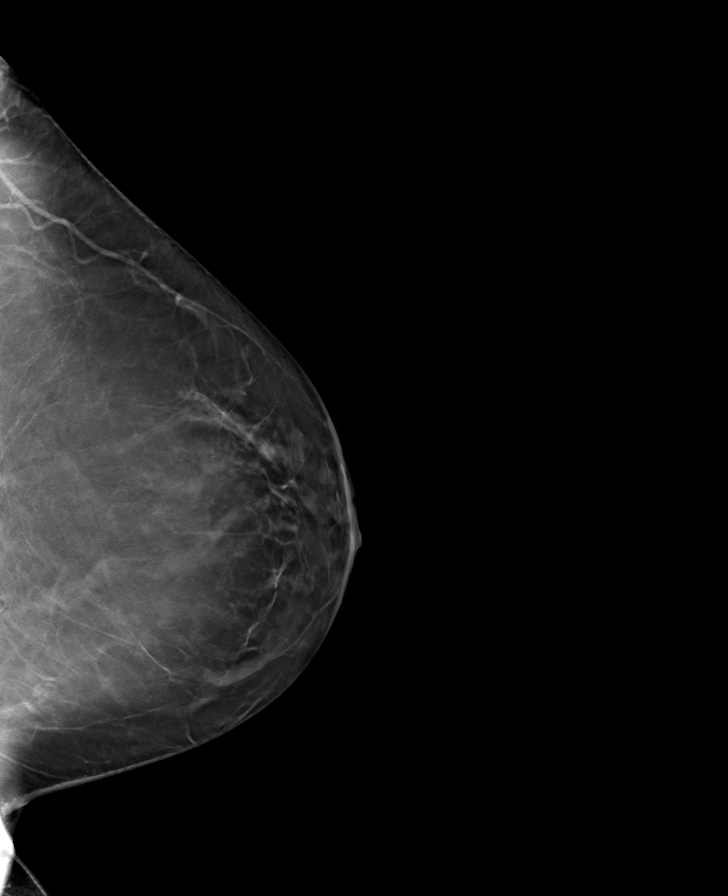

[8 of 24 positions shown; findings below may reference images not displayed]

ACR Breast Density Category b: There are scattered areas of
fibroglandular density.
FINDINGS: There are no findings suspicious for malignancy. Images were
processed with CAD.
IMPRESSION: No mammographic evidence of malignancy. A result letter of this
screening mammogram will be mailed directly to the patient.

RECOMMENDATION:
Screening mammogram in one year. (Code:[id number])

BI-RADS CATEGORY  1: Negative.

## 2020-09-09 ENCOUNTER — Inpatient Hospital Stay: Admission: RE | Admit: 2020-09-09 | Payer: Self-pay | Source: Ambulatory Visit

## 2020-11-03 ENCOUNTER — Ambulatory Visit: Payer: Self-pay

## 2022-01-10 ENCOUNTER — Other Ambulatory Visit: Payer: Self-pay | Admitting: Family Medicine

## 2022-01-10 ENCOUNTER — Ambulatory Visit
Admission: RE | Admit: 2022-01-10 | Discharge: 2022-01-10 | Disposition: A | Discharge status: Home / Self Care | Payer: Self-pay | Source: Ambulatory Visit | Attending: Family Medicine | Admitting: Family Medicine

## 2022-01-10 DIAGNOSIS — M79671 Pain in right foot: Secondary | ICD-10-CM

## 2022-08-25 ENCOUNTER — Other Ambulatory Visit: Payer: Self-pay | Admitting: Internal Medicine

## 2022-08-25 DIAGNOSIS — Z1231 Encounter for screening mammogram for malignant neoplasm of breast: Secondary | ICD-10-CM

## 2022-10-26 ENCOUNTER — Ambulatory Visit: Payer: Self-pay

## 2022-11-04 ENCOUNTER — Ambulatory Visit: Payer: Self-pay

## 2022-12-12 ENCOUNTER — Ambulatory Visit: Payer: Self-pay

## 2022-12-23 ENCOUNTER — Ambulatory Visit
Admission: RE | Admit: 2022-12-23 | Discharge: 2022-12-23 | Disposition: A | Discharge status: Home / Self Care | Payer: BC Managed Care – PPO | Source: Ambulatory Visit | Attending: Internal Medicine | Admitting: Internal Medicine

## 2022-12-23 DIAGNOSIS — Z1231 Encounter for screening mammogram for malignant neoplasm of breast: Secondary | ICD-10-CM

## 2023-12-04 ENCOUNTER — Other Ambulatory Visit: Payer: Self-pay | Admitting: Internal Medicine

## 2023-12-04 DIAGNOSIS — Z1231 Encounter for screening mammogram for malignant neoplasm of breast: Secondary | ICD-10-CM

## 2023-12-29 ENCOUNTER — Ambulatory Visit
Admission: RE | Admit: 2023-12-29 | Discharge: 2023-12-29 | Disposition: A | Discharge status: Home / Self Care | Payer: Self-pay | Source: Ambulatory Visit | Attending: Internal Medicine | Admitting: Internal Medicine

## 2023-12-29 DIAGNOSIS — Z1231 Encounter for screening mammogram for malignant neoplasm of breast: Secondary | ICD-10-CM

## 2024-03-31 NOTE — Progress Notes (Unsigned)
 Cardiology Office Note:  .   Date:  04/02/2024  ID:  Natasha Burke, DOB [birth year], MRN [id number] PCP: Vernon Velna SAUNDERS, MD  [facility] Providers Cardiologist:  None { History of Present Illness: .    Chief Complaint  Patient presents with   famiily history of heart disease    Natasha Burke is a 58 y.o. female with history of HLD who presents for the evaluation of family history of heart disease at the request of Pahwani, Velna SAUNDERS, MD.   History of Present Illness   Natasha Burke is a 58 year old female who presents for evaluation of family history of heart disease.  She is adopted and has no known family medical history. Her concern about heart disease arises from her 66 year old son, who recently underwent open heart surgery for infective endocarditis and a bicuspid aortic valve, which was replaced with a mechanical valve. 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  She has no personal history of heart disease, high blood pressure, diabetes, or hyperlipidemia. No history of heart attacks or strokes. She has not undergone any cardiac diagnostic studies such as an echocardiogram in the past. She is adopted.   Her past surgical history includes a right ankle fracture repair, right shoulder surgery for bone spurs, two cesarean sections, and removal of a fallopian ovary. She denies smoking and reports occasional alcohol use.  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 She is married and has two sons, aged 53 and 46.  No chest pain or trouble breathing. A recent transesophageal echocardiogram revealed a leak at the surgical site following her son's surgery.       T chol 189, HDL 71, LDL 107, TG 62 A1c 5.6     Problem List Son with Bicuspid aortic valve     ROS: All other ROS reviewed and negative. Pertinent positives noted  in the HPI.     Studies Reviewed: SABRA   EKG Interpretation Date/Time:  Tuesday April 02 2024 08:12:52 EST Ventricular Rate:  70 PR Interval:  128 QRS Duration:  86 QT Interval:  378 QTC Calculation: 408 R Axis:   40  Text Interpretation: Normal sinus rhythm Normal ECG Confirmed by Barbaraann Kotyk [phone]) on 04/02/2024 8:20:37 AM   Physical Exam:   VS:  BP 112/74   Pulse 70   Ht 5' 7 (1.702 m)   Wt 184 lb (83.5 kg)   LMP 06/10/2017 (Exact Date)   SpO2 96%   BMI 28.82 kg/m    Wt Readings from Last 3 Encounters:  04/02/24 184 lb (83.5 kg)  06/27/17 205 lb 9.6 oz (93.3 kg)    GEN: Well nourished, well developed in no acute distress NECK: No JVD; No carotid bruits CARDIAC: RRR, no murmurs, rubs, gallops RESPIRATORY:  Clear to auscultation without rales, wheezing or rhonchi  ABDOMEN: Soft, non-tender, non-distended EXTREMITIES:  No edema; No deformity  ASSESSMENT AND PLAN: .   Assessment and Plan    Screening for family history of heart disease Son with Bicuspid aortic valve  No known family history of heart disease due to adoption. Asymptomatic with normal EKG. Potential genetic component due to son's condition. - Ordered echocardiogram to evaluate for bicuspid aortic valve. - Scheduled follow-up based on echocardiogram results.              Follow-up: Return  if symptoms worsen or fail to improve.  Signed, Darryle DASEN. Barbaraann, MD, [facility]  [facility]  [address] [place], [place] [address] [phone]  8:29 AM

## 2024-04-02 ENCOUNTER — Encounter: Payer: Self-pay | Admitting: Cardiovascular Disease

## 2024-04-02 ENCOUNTER — Ambulatory Visit: Attending: Cardiovascular Disease | Admitting: Cardiovascular Disease

## 2024-04-02 VITALS — BP 112/74 | HR 70 | Ht 67.0 in | Wt 184.0 lb

## 2024-04-02 DIAGNOSIS — Z8249 Family history of ischemic heart disease and other diseases of the circulatory system: Secondary | ICD-10-CM

## 2024-04-02 NOTE — Patient Instructions (Signed)
 Medication Instructions:  Your physician recommends that you continue on your current medications as directed. Please refer to the Current Medication list given to you today.  *If you need a refill on your cardiac medications before your next appointment, please call your pharmacy*  Lab Work: None  If you have labs (blood work) drawn today and your tests are completely normal, you will receive your results only by: MyChart Message (if you have MyChart) OR A paper copy in the mail If you have any lab test that is abnormal or we need to change your treatment, we will call you to review the results.  Testing/Procedures: Echocardiogram  Your physician has requested that you have an echocardiogram. Echocardiography is a painless test that uses sound waves to create images of your heart. It provides your doctor with information about the size and shape of your heart and how well your heart's chambers and valves are working. This procedure takes approximately one hour. There are no restrictions for this procedure. Please do NOT wear cologne, perfume, aftershave, or lotions (deodorant is allowed). Please arrive 15 minutes prior to your appointment time.  Please note: We ask at that you not bring children with you during ultrasound (echo/ vascular) testing. Due to room size and safety concerns, children are not allowed in the ultrasound rooms during exams. Our front office staff cannot provide observation of children in our lobby area while testing is being conducted. An adult accompanying a patient to their appointment will only be allowed in the ultrasound room at the discretion of the ultrasound technician under special circumstances. We apologize for any inconvenience.   Follow-Up: At [facility], you and your health needs are our priority.  As part of our continuing mission to provide you with exceptional heart care, our providers are all part of one team.  This team includes your primary  Cardiologist (physician) and Advanced Practice Providers or APPs (Physician Assistants and Nurse Practitioners) who all work together to provide you with the care you need, when you need it.  Your next appointment:    As needed.   Provider:   Dr. Debby Decent   We recommend signing up for the patient portal called MyChart.  Sign up information is provided on this After Visit Summary.  MyChart is used to connect with patients for Virtual Visits (Telemedicine).  Patients are able to view lab/test results, encounter notes, upcoming appointments, etc.  Non-urgent messages can be sent to your provider as well.   To learn more about what you can do with MyChart, go to [email or web address].   Other Instructions None

## 2024-05-08 ENCOUNTER — Ambulatory Visit (HOSPITAL_COMMUNITY)
Admission: RE | Admit: 2024-05-08 | Discharge: 2024-05-08 | Disposition: A | Discharge status: Home / Self Care | Source: Ambulatory Visit | Attending: Cardiovascular Disease | Admitting: Cardiovascular Disease

## 2024-05-08 DIAGNOSIS — I517 Cardiomegaly: Secondary | ICD-10-CM

## 2024-05-08 DIAGNOSIS — Z8249 Family history of ischemic heart disease and other diseases of the circulatory system: Secondary | ICD-10-CM | POA: Diagnosis present

## 2024-05-08 DIAGNOSIS — Z136 Encounter for screening for cardiovascular disorders: Secondary | ICD-10-CM | POA: Insufficient documentation

## 2024-05-09 LAB — ECHOCARDIOGRAM COMPLETE
Area-P 1/2: 3.17 cm2
S' Lateral: 3 cm

## 2024-05-10 ENCOUNTER — Ambulatory Visit: Payer: Self-pay | Admitting: Cardiovascular Disease
